# Patient Record
Sex: Female | Born: 1960 | Race: Black or African American | Hispanic: No | Marital: Married | Smoking: Never smoker
Health system: Southern US, Community
[De-identification: ages and names within clinical notes are randomized; demographics above are authoritative.]

## PROBLEM LIST (undated history)

## (undated) DIAGNOSIS — Z789 Other specified health status: Secondary | ICD-10-CM

## (undated) HISTORY — DX: Other specified health status: Z78.9

## (undated) HISTORY — PX: TUBAL LIGATION: SHX77

---

## 2000-02-29 ENCOUNTER — Emergency Department (HOSPITAL_COMMUNITY): Admission: EM | Admit: 2000-02-29 | Discharge: 2000-02-29 | Payer: Self-pay | Admitting: Emergency Medicine

## 2000-07-01 ENCOUNTER — Emergency Department (HOSPITAL_COMMUNITY): Admission: EM | Admit: 2000-07-01 | Discharge: 2000-07-01 | Payer: Self-pay | Admitting: Emergency Medicine

## 2000-07-01 ENCOUNTER — Encounter: Payer: Self-pay | Admitting: Emergency Medicine

## 2005-01-16 ENCOUNTER — Other Ambulatory Visit: Admission: RE | Admit: 2005-01-16 | Discharge: 2005-01-16 | Payer: Self-pay | Admitting: Obstetrics and Gynecology

## 2006-06-17 ENCOUNTER — Encounter: Admission: RE | Admit: 2006-06-17 | Discharge: 2006-06-17 | Payer: Self-pay | Admitting: Obstetrics and Gynecology

## 2006-07-08 ENCOUNTER — Ambulatory Visit (HOSPITAL_COMMUNITY): Admission: RE | Admit: 2006-07-08 | Discharge: 2006-07-08 | Payer: Self-pay | Admitting: Obstetrics and Gynecology

## 2006-08-04 ENCOUNTER — Encounter (INDEPENDENT_AMBULATORY_CARE_PROVIDER_SITE_OTHER): Payer: Self-pay | Admitting: Obstetrics and Gynecology

## 2006-08-04 ENCOUNTER — Ambulatory Visit (HOSPITAL_COMMUNITY): Admission: RE | Admit: 2006-08-04 | Discharge: 2006-08-04 | Payer: Self-pay | Admitting: Obstetrics and Gynecology

## 2009-10-23 ENCOUNTER — Other Ambulatory Visit: Admission: RE | Admit: 2009-10-23 | Discharge: 2009-10-23 | Payer: Self-pay | Admitting: Obstetrics and Gynecology

## 2010-02-24 ENCOUNTER — Encounter: Payer: Self-pay | Admitting: Obstetrics and Gynecology

## 2010-06-18 NOTE — Op Note (Signed)
Ann Aguilar, Ann Aguilar         ACCOUNT NO.:  [id number]   MEDICAL RECORD NO.:  [id number]          PATIENT TYPE:  AMB   LOCATION:  SDC                           FACILITY:  [facility]   PHYSICIAN:  Osborn Coho, M.D.   DATE OF BIRTH:  04/18/1960   DATE OF PROCEDURE:  DATE OF DISCHARGE:                               OPERATIVE REPORT   PREOPERATIVE DIAGNOSIS:  Menorrhagia.   POSTOPERATIVE DIAGNOSIS:  Menorrhagia.   PROCEDURE:  1. Hysteroscopy.  2. Dilation and curettage.  3. Endometrial ablation via NovaSure.   SURGEON:  Osborn Coho, M.D.   ANESTHESIA:  General anesthesia via LMA.   SPECIMENS TO PATHOLOGY:  Endometrial curettings.   FLUIDS:  Was 900 mL.   URINE OUTPUT:  Was quantity sufficient via straight catheterization  prior to procedure.   ESTIMATED BLOOD LOSS:  Minimal.   COMPLICATIONS:  None.   HYSTEROSCOPIC FLUID DEFICIT:  Was lactated Ringer's 95 mL.   FINDINGS:  The uterus sounded to 11.5 cm.  Cervical length measured 5  cm.  The cavity length was 6.5 cm.  The cavity width was 4.8 cm.  Power  was 172 watts.  Time was 1 minute and 5 seconds.   DESCRIPTION OF PROCEDURE:  The patient was taken to the operating room  after the risks, benefits and alternatives were reviewed with the  patient.  The patient verbalized understanding and a consent was signed  and witnessed.  The patient was placed under general per anesthesia and  prepped and draped in the normal sterile fashion in the dorsal lithotomy  position.  A bi-valve speculum was placed in the patient's vagina and  the anterior lip of the cervix grasped with a single-tooth tenaculum.  A  paracervical block administered using a total of 10 mL of 1% lidocaine.  The cervix was dilated for the passage of the hysteroscope and  measurements taken as above.  The hysteroscope was introduced and no  inter-cavitary lesions were noted.  The cervix was dilated for passage  of the NovaSure instrument.  The NovaSure  instrument was then  introduced.  Ablation was performed with findings as noted above.  The  endometrial cavity was adequately ablated and all instruments were  removed.  The hysteroscopy was performed after the ablation, to confirm  ablation.  The sponge count was correct.   The patient tolerated the procedure well and is currently awaiting  extubation or awakening and will be transferred to the recovery room.      Osborn Coho, M.D.  Electronically Signed     AR/MEDQ  D:  08/04/2006  T:  08/04/2006  Job:  [id number]

## 2010-11-19 LAB — HCG, SERUM, QUALITATIVE: Preg, Serum: NEGATIVE

## 2010-11-19 LAB — CBC
HCT: 33.2 — ABNORMAL LOW
Hemoglobin: 10.9 — ABNORMAL LOW
MCHC: 32.8
MCV: 84.1
Platelets: 336
RBC: 3.95
RDW: 16.1 — ABNORMAL HIGH
WBC: 4.7

## 2016-03-26 ENCOUNTER — Other Ambulatory Visit (HOSPITAL_COMMUNITY)
Admission: RE | Admit: 2016-03-26 | Discharge: 2016-03-26 | Disposition: A | Discharge status: Home / Self Care | Payer: Self-pay | Source: Ambulatory Visit | Attending: Family Medicine | Admitting: Family Medicine

## 2016-03-26 ENCOUNTER — Other Ambulatory Visit: Payer: Self-pay | Admitting: Family Medicine

## 2016-03-26 DIAGNOSIS — Z0000 Encounter for general adult medical examination without abnormal findings: Secondary | ICD-10-CM | POA: Insufficient documentation

## 2016-03-26 DIAGNOSIS — Z1151 Encounter for screening for human papillomavirus (HPV): Secondary | ICD-10-CM | POA: Insufficient documentation

## 2016-04-01 LAB — CYTOLOGY - PAP
Diagnosis: UNDETERMINED — AB
HPV: NOT DETECTED

## 2017-04-10 ENCOUNTER — Other Ambulatory Visit: Payer: Self-pay | Admitting: Family Medicine

## 2017-04-10 ENCOUNTER — Other Ambulatory Visit (HOSPITAL_COMMUNITY)
Admission: RE | Admit: 2017-04-10 | Discharge: 2017-04-10 | Disposition: A | Discharge status: Home / Self Care | Payer: BLUE CROSS/BLUE SHIELD | Source: Ambulatory Visit | Attending: Family Medicine | Admitting: Family Medicine

## 2017-04-10 DIAGNOSIS — Z01419 Encounter for gynecological examination (general) (routine) without abnormal findings: Secondary | ICD-10-CM | POA: Diagnosis not present

## 2017-04-15 LAB — CYTOLOGY - PAP
Diagnosis: UNDETERMINED — AB
HPV: NOT DETECTED

## 2018-07-25 ENCOUNTER — Other Ambulatory Visit: Payer: Self-pay | Admitting: *Deleted

## 2018-07-25 DIAGNOSIS — Z20822 Contact with and (suspected) exposure to covid-19: Secondary | ICD-10-CM

## 2018-08-01 LAB — NOVEL CORONAVIRUS, NAA: SARS-CoV-2, NAA: NOT DETECTED

## 2020-11-09 ENCOUNTER — Ambulatory Visit (INDEPENDENT_AMBULATORY_CARE_PROVIDER_SITE_OTHER): Payer: PRIVATE HEALTH INSURANCE | Admitting: Obstetrics

## 2020-11-09 ENCOUNTER — Encounter: Payer: Self-pay | Admitting: Obstetrics

## 2020-11-09 ENCOUNTER — Other Ambulatory Visit: Payer: Self-pay

## 2020-11-09 ENCOUNTER — Other Ambulatory Visit (HOSPITAL_COMMUNITY)
Admission: RE | Admit: 2020-11-09 | Discharge: 2020-11-09 | Disposition: A | Discharge status: Home / Self Care | Payer: No Typology Code available for payment source | Source: Ambulatory Visit | Attending: Obstetrics & Gynecology | Admitting: Obstetrics & Gynecology

## 2020-11-09 VITALS — BP 149/90 | HR 80 | Ht 65.0 in | Wt 249.0 lb

## 2020-11-09 DIAGNOSIS — E66813 Obesity, class 3: Secondary | ICD-10-CM

## 2020-11-09 DIAGNOSIS — Z1239 Encounter for other screening for malignant neoplasm of breast: Secondary | ICD-10-CM

## 2020-11-09 DIAGNOSIS — Z01419 Encounter for gynecological examination (general) (routine) without abnormal findings: Secondary | ICD-10-CM | POA: Insufficient documentation

## 2020-11-09 DIAGNOSIS — Z0000 Encounter for general adult medical examination without abnormal findings: Secondary | ICD-10-CM

## 2020-11-09 DIAGNOSIS — Z6841 Body Mass Index (BMI) 40.0 and over, adult: Secondary | ICD-10-CM

## 2020-11-09 NOTE — Progress Notes (Signed)
NEW GYN  presents for AEX/PAP.  Last Mammogram was maybe 2 years ago. Reports no complaints today.

## 2020-11-09 NOTE — Progress Notes (Signed)
Subjective:        Ann Aguilar is a 60 y.o. female here for a routine exam.  Current complaints: None.    Personal health questionnaire:  Is patient Ashkenazi Jewish, have a family history of breast and/or ovarian cancer: no Is there a family history of uterine cancer diagnosed at age < 34, gastrointestinal cancer, urinary tract cancer, family member who is a Personnel officer syndrome-associated carrier: no Is the patient overweight and hypertensive, family history of diabetes, personal history of gestational diabetes, preeclampsia or PCOS: no Is patient over 19, have PCOS,  family history of premature CHD under age 70, diabetes, smoke, have hypertension or peripheral artery disease:  no At any time, has a partner hit, kicked or otherwise hurt or frightened you?: no Over the past 2 weeks, have you felt down, depressed or hopeless?: no Over the past 2 weeks, have you felt little interest or pleasure in doing things?:no   Gynecologic History No LMP recorded. Patient is postmenopausal. Contraception: post menopausal status Last Pap: 2019. Results were: ASCUS with negative HRHPV Last mammogram: ~ 2 years ago. Results were: normal  Obstetric History OB History  Gravida Para Term Preterm AB Living  2 2 2     2   SAB IAB Ectopic Multiple Live Births               # Outcome Date GA Lbr Len/2nd Weight Sex Delivery Anes PTL Lv  2 Term           1 Term             Past Medical History:  Diagnosis Date   Medical history non-contributory     History reviewed. No pertinent surgical history.  No current outpatient medications on file. No Known Allergies  Social History   Tobacco Use   Smoking status: Never   Smokeless tobacco: Never  Substance Use Topics   Alcohol use: Never    History reviewed. No pertinent family history.    Review of Systems  Constitutional: negative for fatigue and weight loss Respiratory: negative for cough and wheezing Cardiovascular: negative for  chest pain, fatigue and palpitations Gastrointestinal: negative for abdominal pain and change in bowel habits Musculoskeletal:negative for myalgias Neurological: negative for gait problems and tremors Behavioral/Psych: negative for abusive relationship, depression Endocrine: negative for temperature intolerance    Genitourinary:negative for abnormal menstrual periods, genital lesions, hot flashes, sexual problems and vaginal discharge Integument/breast: negative for breast lump, breast tenderness, nipple discharge and skin lesion(s)    Objective:       BP (!) 149/90   Pulse 80   Ht 5\' 5"  (1.651 m)   Wt 249 lb (112.9 kg)   BMI 41.44 kg/m  General:   Alert and no distress  Skin:   no rash or abnormalities  Lungs:   clear to auscultation bilaterally  Heart:   regular rate and rhythm, S1, S2 normal, no murmur, click, rub or gallop  Breasts:   normal without suspicious masses, skin or nipple changes or axillary nodes  Abdomen:  normal findings: no organomegaly, soft, non-tender and no hernia  Pelvis:  External genitalia: normal general appearance Urinary system: urethral meatus normal and bladder without fullness, nontender Vaginal: normal without tenderness, induration or masses Cervix: normal appearance Adnexa: normal bimanual exam Uterus: anteverted and non-tender, normal size   Lab Review Urine pregnancy test Labs reviewed yes Radiologic studies reviewed yes  I have spent a total of 20 minutes of face-to-face time, excluding clinical staff  time, reviewing notes and preparing to see patient, ordering tests and/or medications, and counseling the patient.   Assessment:   1. Encounter for routine gynecological examination with Papanicolaou smear of cervix Rx: - Cytology - PAP( [place])  2. Screening breast examination Rx: - MM Digital Screening; Future  3. Class 3 severe obesity due to excess calories without serious comorbidity with body mass index (BMI) of 40.0 to  44.9 in adult (HCC) - weight reduction with the aid of dietary changes, exercise and behavioral modification recommended  4. Routine health maintenance Rx: - Ambulatory referral to Internal Medicine     Plan:    Education reviewed: calcium supplements, depression evaluation, low fat, low cholesterol diet, safe sex/STD prevention, self breast exams, and weight bearing exercise. Mammogram ordered. Follow up in: 1 year.    Orders Placed This Encounter  Procedures   MM Digital Screening    Standing Status:   Future    Standing Expiration Date:   11/08/2021    Order Specific Question:   Reason for Exam (SYMPTOM  OR DIAGNOSIS REQUIRED)    Answer:   Screening    Order Specific Question:   Is the patient pregnant?    Answer:   No    Order Specific Question:   Preferred imaging location?    Answer:   [facility]   Ambulatory referral to Internal Medicine    Referral Priority:   Routine    Referral Type:   Consultation    Referral Reason:   Specialty Services Required    Requested Specialty:   Internal Medicine    Number of Visits Requested:   1     Brock Bad, MD 11/09/2020 10:04 AM

## 2020-11-13 ENCOUNTER — Ambulatory Visit (HOSPITAL_BASED_OUTPATIENT_CLINIC_OR_DEPARTMENT_OTHER): Admission: RE | Admit: 2020-11-13 | Payer: PRIVATE HEALTH INSURANCE | Source: Ambulatory Visit | Admitting: Radiology

## 2020-11-13 LAB — CYTOLOGY - PAP
Comment: NEGATIVE
Diagnosis: NEGATIVE
Diagnosis: REACTIVE
High risk HPV: NEGATIVE

## 2020-11-14 ENCOUNTER — Other Ambulatory Visit: Payer: Self-pay

## 2020-11-14 ENCOUNTER — Ambulatory Visit (HOSPITAL_BASED_OUTPATIENT_CLINIC_OR_DEPARTMENT_OTHER)
Admission: RE | Admit: 2020-11-14 | Discharge: 2020-11-14 | Disposition: A | Discharge status: Home / Self Care | Payer: PRIVATE HEALTH INSURANCE | Source: Ambulatory Visit | Attending: Obstetrics | Admitting: Obstetrics

## 2020-11-14 DIAGNOSIS — Z1231 Encounter for screening mammogram for malignant neoplasm of breast: Secondary | ICD-10-CM | POA: Diagnosis not present

## 2020-11-14 DIAGNOSIS — Z1239 Encounter for other screening for malignant neoplasm of breast: Secondary | ICD-10-CM

## 2020-11-20 ENCOUNTER — Other Ambulatory Visit: Payer: Self-pay | Admitting: Obstetrics

## 2020-11-20 DIAGNOSIS — R928 Other abnormal and inconclusive findings on diagnostic imaging of breast: Secondary | ICD-10-CM

## 2020-12-12 ENCOUNTER — Other Ambulatory Visit: Payer: Self-pay

## 2020-12-12 DIAGNOSIS — R928 Other abnormal and inconclusive findings on diagnostic imaging of breast: Secondary | ICD-10-CM

## 2021-01-08 ENCOUNTER — Ambulatory Visit: Payer: Self-pay | Admitting: *Deleted

## 2021-01-08 ENCOUNTER — Ambulatory Visit
Admission: RE | Admit: 2021-01-08 | Discharge: 2021-01-08 | Disposition: A | Discharge status: Home / Self Care | Payer: PRIVATE HEALTH INSURANCE | Source: Ambulatory Visit | Attending: Obstetrics and Gynecology | Admitting: Obstetrics and Gynecology

## 2021-01-08 ENCOUNTER — Other Ambulatory Visit: Payer: Self-pay

## 2021-01-08 ENCOUNTER — Ambulatory Visit
Admission: RE | Admit: 2021-01-08 | Discharge: 2021-01-08 | Disposition: A | Discharge status: Home / Self Care | Payer: No Typology Code available for payment source | Source: Ambulatory Visit | Attending: Obstetrics and Gynecology | Admitting: Obstetrics and Gynecology

## 2021-01-08 VITALS — BP 138/102 | Wt 249.6 lb

## 2021-01-08 DIAGNOSIS — Z1239 Encounter for other screening for malignant neoplasm of breast: Secondary | ICD-10-CM

## 2021-01-08 DIAGNOSIS — R928 Other abnormal and inconclusive findings on diagnostic imaging of breast: Secondary | ICD-10-CM

## 2021-01-08 DIAGNOSIS — Z1211 Encounter for screening for malignant neoplasm of colon: Secondary | ICD-10-CM

## 2021-01-08 NOTE — Patient Instructions (Signed)
Explained breast self awareness with Ann Aguilar. Patient did not need a Pap smear today due to last Pap smear and HPV typing was 11/09/2020. Let her know [facility]CP will cover Pap smears and HPV typing every 5 years unless has a history of abnormal Pap smears. Referred patient to the [facility] for a right breast diagnostic mammogram per recommendation. Appointment scheduled Tuesday, January 08, 2021 at 1010. Patient aware of appointment and will be there. Ann Aguilar verbalized understanding.  Ann Aguilar, [name], RN 8:48 AM

## 2021-01-08 NOTE — Progress Notes (Signed)
[name] is a 60 y.o. female who presents to [facility] clinic today with no complaints. Patient referred to [facility] by the [facility] due to having a screening mammogram on 11/14/2020 that additional imaging of the right breast is recommended for follow-up   Pap Smear: Pap smear not completed today. Last Pap smear was 11/09/2020 at [facility] clinic and was normal with negative HPV. Per patient has no history of an abnormal Pap smear. Last Pap smear result is available in Epic.   Physical exam: Breasts Breasts symmetrical. No skin abnormalities bilateral breasts. No nipple retraction bilateral breasts. No nipple discharge bilateral breasts. No lymphadenopathy. No lumps palpated bilateral breasts. No complaints of pain or tenderness on exam.     MM 3D SCREEN BREAST BILATERAL  Result Date: 11/19/2020 CLINICAL DATA:  Screening. EXAM: DIGITAL SCREENING BILATERAL MAMMOGRAM WITH TOMOSYNTHESIS AND CAD TECHNIQUE: Bilateral screening digital craniocaudal and mediolateral oblique mammograms were obtained. Bilateral screening digital breast tomosynthesis was performed. The images were evaluated with computer-aided detection. COMPARISON:  Previous exam(s). ACR Breast Density Category b: There are scattered areas of fibroglandular density. FINDINGS: In the right breast, a possible mass and a possible asymmetry warrants further evaluation. The possible mass is seen within the upper RIGHT breast on MLO slice 27. The possible asymmetry is seen within the outer RIGHT breast on cc slice 32. In the left breast, no findings suspicious for malignancy. IMPRESSION: Further evaluation is suggested for a possible mass and a possible asymmetry in the right breast. RECOMMENDATION: Diagnostic mammogram and possibly ultrasound of the right breast. (Code:[id number]) The patient will be contacted regarding the findings, and additional imaging will be scheduled. BI-RADS  CATEGORY  0: Incomplete. Need additional imaging evaluation and/or prior mammograms for comparison. Electronically Signed   By: Bary Richard M.D.   On: 11/19/2020 11:57     Pelvic/Bimanual Pap is not indicated today per BCCCP guidelines.    Smoking History: Patient has never smoked.   Patient Navigation: Patient education provided. Access to services provided for patient through BCCCP program.   Colorectal Cancer Screening: Per patient had a colonoscopy completed around 8 years ago. FIT Test given to patient to complete. No complaints today.    Breast and Cervical Cancer Risk Assessment: Patient does not have family history of breast cancer, known genetic mutations, or radiation treatment to the chest before age 85. Patient does not have history of cervical dysplasia, immunocompromised, or DES exposure in-utero.  Risk Assessment     Risk Scores       01/08/2021   Last edited by: Meryl Dare, CMA   5-year risk: 1.4 %   Lifetime risk: 7.3 %            A: BCCCP exam without pap smear No complaints.  P: Referred patient to the [facility] for a right breast diagnostic mammogram per recommendation. Appointment scheduled Tuesday, January 08, 2021 at 1010.  Priscille Heidelberg, RN 01/08/2021 8:48 AM

## 2021-09-19 ENCOUNTER — Other Ambulatory Visit: Payer: Self-pay

## 2021-09-19 DIAGNOSIS — Z1231 Encounter for screening mammogram for malignant neoplasm of breast: Secondary | ICD-10-CM

## 2021-11-12 ENCOUNTER — Ambulatory Visit: Payer: No Typology Code available for payment source

## 2021-11-12 ENCOUNTER — Inpatient Hospital Stay: Admission: RE | Admit: 2021-11-12 | Payer: No Typology Code available for payment source | Source: Ambulatory Visit

## 2022-01-09 ENCOUNTER — Ambulatory Visit
Admission: RE | Admit: 2022-01-09 | Discharge: 2022-01-09 | Disposition: A | Discharge status: Home / Self Care | Payer: No Typology Code available for payment source | Source: Ambulatory Visit | Attending: Obstetrics and Gynecology | Admitting: Obstetrics and Gynecology

## 2022-01-09 ENCOUNTER — Ambulatory Visit: Payer: Self-pay | Admitting: Hematology and Oncology

## 2022-01-09 VITALS — BP 130/90 | Wt 255.0 lb

## 2022-01-09 DIAGNOSIS — Z1231 Encounter for screening mammogram for malignant neoplasm of breast: Secondary | ICD-10-CM

## 2022-01-09 DIAGNOSIS — Z1211 Encounter for screening for malignant neoplasm of colon: Secondary | ICD-10-CM

## 2022-01-09 NOTE — Progress Notes (Signed)
Ann Aguilar is a 61 y.o. female who presents to [facility] clinic today with no complaints.    Pap Smear: Pap not smear completed today. Last Pap smear was 2022 and was normal. Per patient has history of an abnormal Pap smear. Last Pap smear result is not available in Epic. 2018 ASCUS/ HPV-; 2019 ASCUS/ HPV-; 2022 Negative/ PV-   Physical exam: Breasts Breasts symmetrical. No skin abnormalities bilateral breasts. No nipple retraction bilateral breasts. No nipple discharge bilateral breasts. No lymphadenopathy. No lumps palpated bilateral breasts.     MS DIGITAL DIAG TOMO UNI RIGHT  Result Date: 01/08/2021 CLINICAL DATA:  Screening recall for a possible mass and a possible asymmetry in the right breast. EXAM: DIGITAL DIAGNOSTIC UNILATERAL RIGHT MAMMOGRAM WITH TOMOSYNTHESIS AND CAD; ULTRASOUND RIGHT BREAST LIMITED TECHNIQUE: Right digital diagnostic mammography and breast tomosynthesis was performed. The images were evaluated with computer-aided detection.; Targeted ultrasound examination of the right breast was performed COMPARISON:  11/14/2020 and 06/17/2006. ACR Breast Density Category b: There are scattered areas of fibroglandular density. FINDINGS: The possible mass and possible asymmetry both disperse with spot compression imaging consistent with superimposed fibroglandular tissue. There is no defined mass, significant residual asymmetry, area of architectural distortion and there are no suspicious calcifications. On physical exam, no mass is palpated in the upper outer right breast. Targeted ultrasound is performed, showing normal fibroglandular tissue throughout the upper inner and upper outer right breast. No mass or suspicious lesion. IMPRESSION: 1. No evidence of breast malignancy. RECOMMENDATION: Screening mammogram in one year.(Code:[id number]) I have discussed the findings and recommendations with the patient. If applicable, a reminder letter will be sent to the patient regarding the next  appointment. BI-RADS CATEGORY  1: Negative. Electronically Signed   By: Amie Portland M.D.   On: 01/08/2021 11:16   MM 3D SCREEN BREAST BILATERAL  Result Date: 11/19/2020 CLINICAL DATA:  Screening. EXAM: DIGITAL SCREENING BILATERAL MAMMOGRAM WITH TOMOSYNTHESIS AND CAD TECHNIQUE: Bilateral screening digital craniocaudal and mediolateral oblique mammograms were obtained. Bilateral screening digital breast tomosynthesis was performed. The images were evaluated with computer-aided detection. COMPARISON:  Previous exam(s). ACR Breast Density Category b: There are scattered areas of fibroglandular density. FINDINGS: In the right breast, a possible mass and a possible asymmetry warrants further evaluation. The possible mass is seen within the upper RIGHT breast on MLO slice 27. The possible asymmetry is seen within the outer RIGHT breast on cc slice 32. In the left breast, no findings suspicious for malignancy. IMPRESSION: Further evaluation is suggested for a possible mass and a possible asymmetry in the right breast. RECOMMENDATION: Diagnostic mammogram and possibly ultrasound of the right breast. (Code:[id number]) The patient will be contacted regarding the findings, and additional imaging will be scheduled. BI-RADS CATEGORY  0: Incomplete. Need additional imaging evaluation and/or prior mammograms for comparison. Electronically Signed   By: Bary Richard M.D.   On: 11/19/2020 11:57      Pelvic/Bimanual Pap is not indicated today    Smoking History: Patient has never smoked and was not referred to quit line.    Patient Navigation: Patient education provided. Access to services provided for patient through [facility] program. No interpreter provided. No transportation provided   Colorectal Cancer Screening: Per patient has never had colonoscopy completed No complaints today. FIT given.   Breast and Cervical Cancer Risk Assessment: Patient does not have family history of breast cancer, known genetic  mutations, or radiation treatment to the chest before age 40. Patient does not have history of cervical  dysplasia, immunocompromised, or DES exposure in-utero.  Risk Assessment   No risk assessment data for the current encounter  Risk Scores       01/08/2021   Last edited by: Royston Bake, CMA   5-year risk: 1.4 %   Lifetime risk: 7.3 %            A: BCCCP exam without pap smear No complaints with benign exam.   P: Referred patient to the [facility] for a screening mammogram. Appointment scheduled 01/09/22.  Melodye Ped, NP 01/09/2022 2:34 PM

## 2022-01-09 NOTE — Patient Instructions (Signed)
Taught Ann Aguilar about self breast awareness and gave educational materials to take home. Patient did not need a Pap smear today due to last Pap smear was in 2022 per patient and was normal. She requests pap next year.  Let her know B[facility]CP will cover Pap smears every 3 years unless has a history of abnormal Pap smears. Referred patient to the [facility] for screening mammogram. Appointment scheduled for 01/09/22. Patient aware of appointment and will be there. Let patient know will follow up with her within the next couple weeks with results. Ann Aguilar verbalized understanding.  Pascal Lux, NP 3:00 PM

## 2022-01-21 NOTE — Progress Notes (Signed)
FIT test has been mailed to the pt 01/21/2022. 

## 2023-01-18 IMAGING — MG MM DIGITAL DIAGNOSTIC UNILAT*R* W/ TOMO W/ CAD
6 series · 6 of 18 positions shown · non-contrast
Comparison: 11/14/2020 and 06/17/2006.

CLINICAL DATA: Screening recall for a possible mass and a possible
asymmetry in the right breast.

EXAM:
DIGITAL DIAGNOSTIC UNILATERAL RIGHT MAMMOGRAM WITH TOMOSYNTHESIS AND
CAD; ULTRASOUND RIGHT BREAST LIMITED
TECHNIQUE: Right digital diagnostic mammography and breast tomosynthesis was
performed. The images were evaluated with computer-aided detection.;
Targeted ultrasound examination of the right breast was performed

[R ML synth-2D]
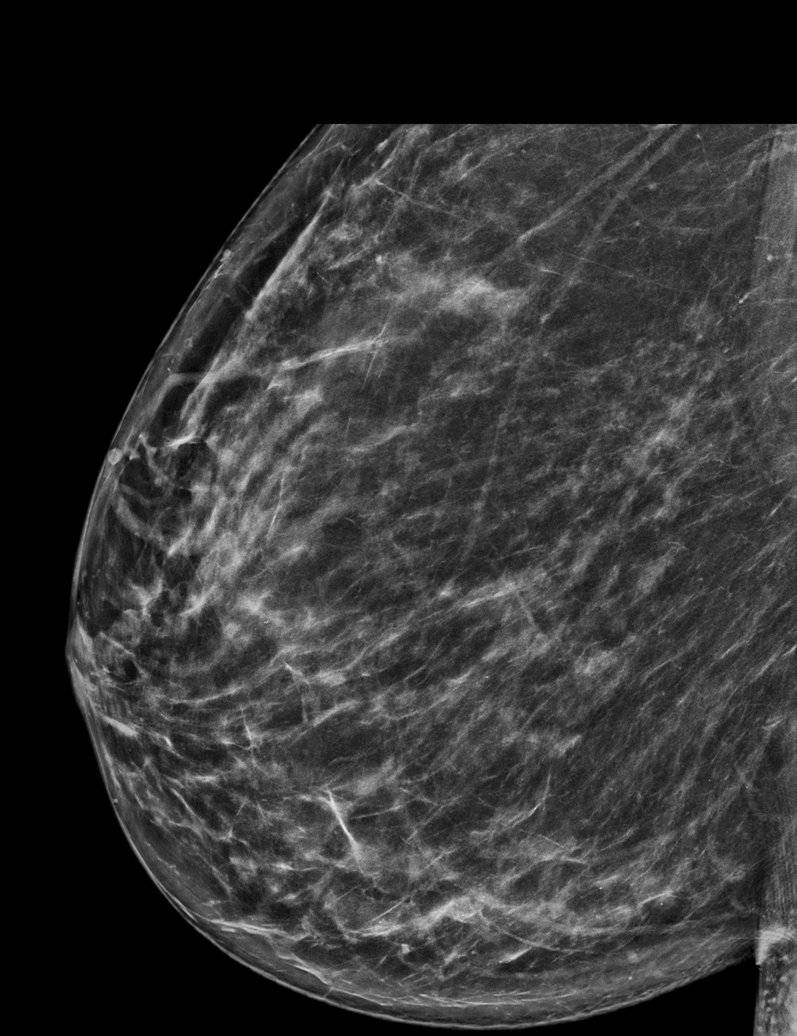

[R CC synth-2D]
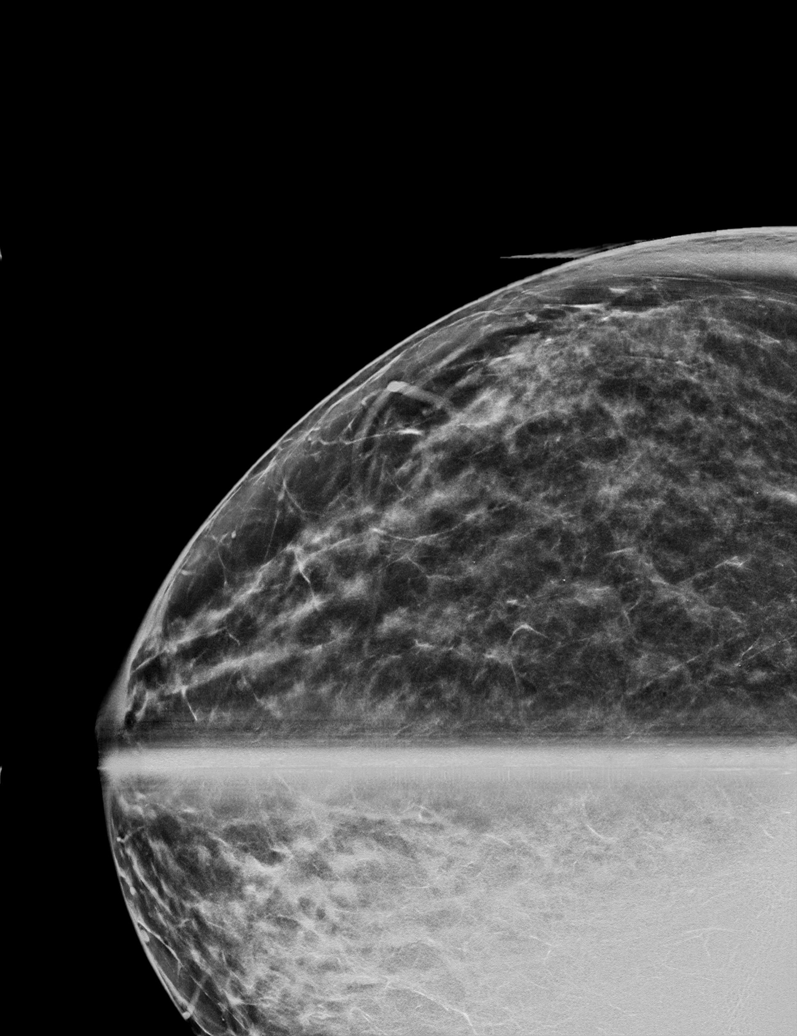

[R MLO synth-2D]
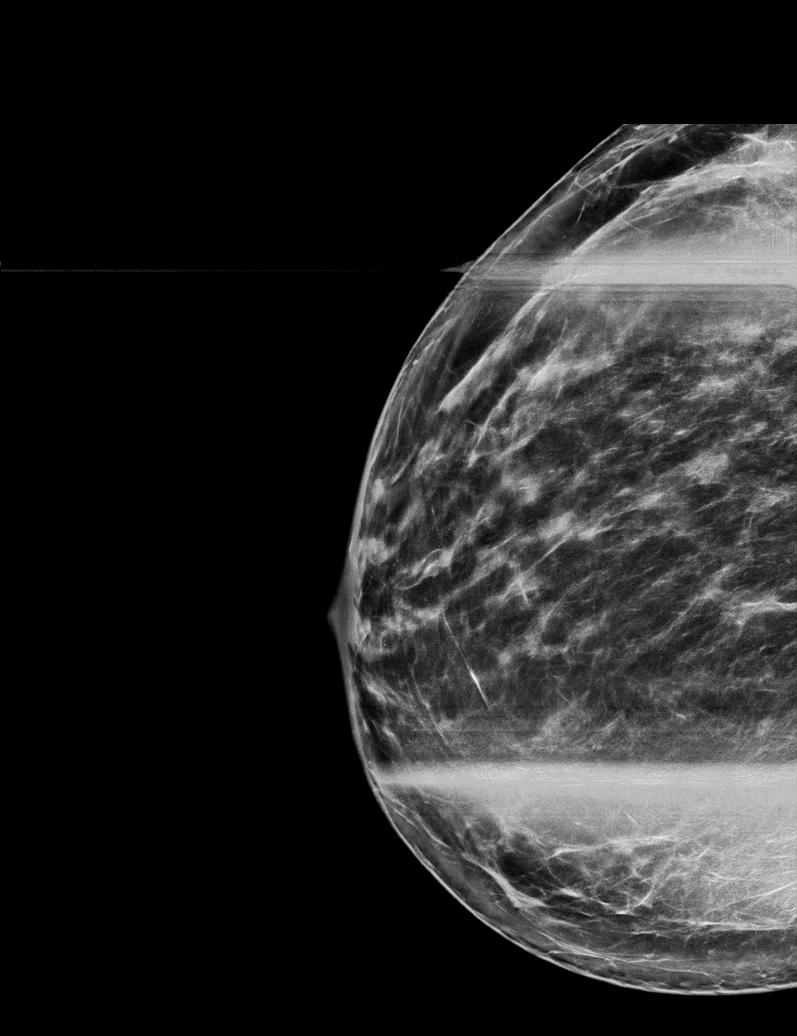

[R ML tomo · tomo slice 39/77.0]
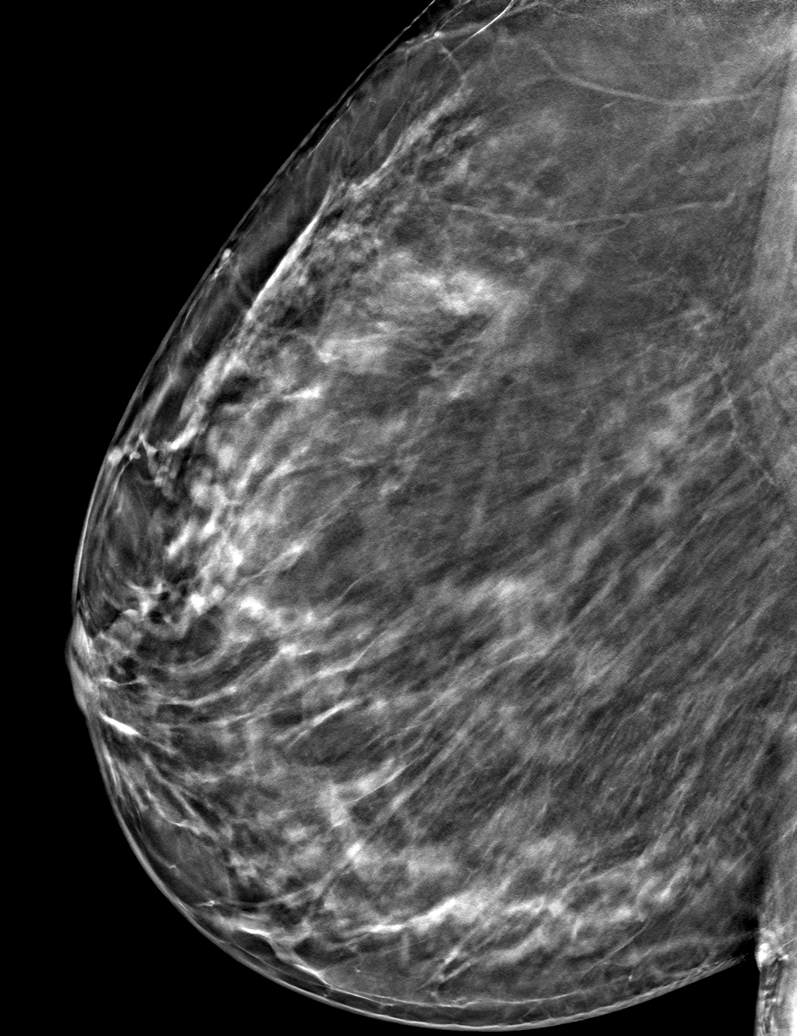

[R MLO tomo · tomo slice 37/74.0]
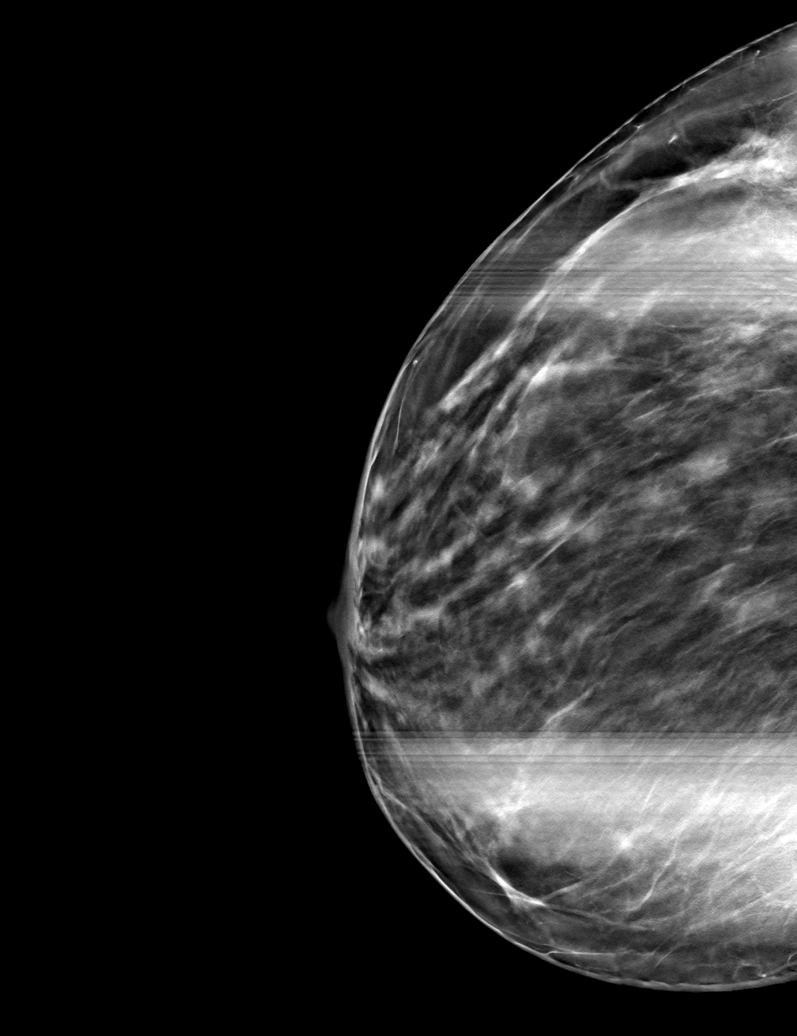

[R CC tomo · tomo slice 34/67.0]
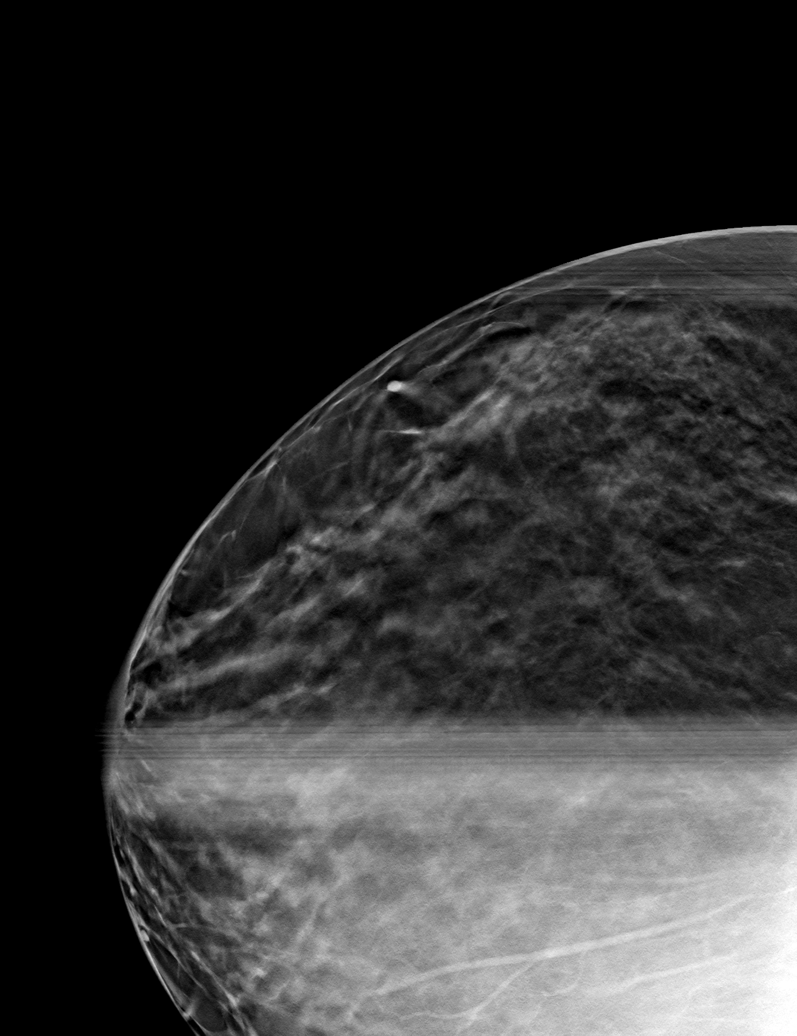

[6 of 18 positions shown; findings below may reference images not displayed]

ACR Breast Density Category b: There are scattered areas of
fibroglandular density.
FINDINGS: The possible mass and possible asymmetry both disperse with spot
compression imaging consistent with superimposed fibroglandular
tissue. There is no defined mass, significant residual asymmetry,
area of architectural distortion and there are no suspicious
calcifications.

On physical exam, no mass is palpated in the upper outer right
breast.

Targeted ultrasound is performed, showing normal fibroglandular
tissue throughout the upper inner and upper outer right breast. No
mass or suspicious lesion.
IMPRESSION: 1. No evidence of breast malignancy.

RECOMMENDATION:
Screening mammogram in one year.(Code:[id number])

I have discussed the findings and recommendations with the patient.
If applicable, a reminder letter will be sent to the patient
regarding the next appointment.

BI-RADS CATEGORY  1: Negative.

## 2023-01-22 ENCOUNTER — Other Ambulatory Visit: Payer: Self-pay | Admitting: Obstetrics and Gynecology

## 2023-01-22 ENCOUNTER — Telehealth: Payer: Self-pay

## 2023-01-22 DIAGNOSIS — Z1231 Encounter for screening mammogram for malignant neoplasm of breast: Secondary | ICD-10-CM

## 2023-01-22 NOTE — Telephone Encounter (Signed)
Patient telephoned BCCCP and will call back and complete screening process.

## 2023-03-05 ENCOUNTER — Ambulatory Visit (INDEPENDENT_AMBULATORY_CARE_PROVIDER_SITE_OTHER): Payer: Self-pay | Admitting: Family

## 2023-03-05 ENCOUNTER — Encounter: Payer: Self-pay | Admitting: Family

## 2023-03-05 ENCOUNTER — Ambulatory Visit: Payer: Self-pay | Admitting: *Deleted

## 2023-03-05 ENCOUNTER — Ambulatory Visit
Admission: RE | Admit: 2023-03-05 | Discharge: 2023-03-05 | Disposition: A | Discharge status: Home / Self Care | Payer: No Typology Code available for payment source | Source: Ambulatory Visit | Attending: Obstetrics and Gynecology | Admitting: Obstetrics and Gynecology

## 2023-03-05 VITALS — BP 149/77 | Wt 260.0 lb

## 2023-03-05 VITALS — BP 151/76 | HR 55 | Temp 97.3°F | Ht 65.0 in | Wt 263.0 lb

## 2023-03-05 DIAGNOSIS — I10 Essential (primary) hypertension: Secondary | ICD-10-CM

## 2023-03-05 DIAGNOSIS — Z1239 Encounter for other screening for malignant neoplasm of breast: Secondary | ICD-10-CM

## 2023-03-05 DIAGNOSIS — Z0000 Encounter for general adult medical examination without abnormal findings: Secondary | ICD-10-CM

## 2023-03-05 DIAGNOSIS — Z1231 Encounter for screening mammogram for malignant neoplasm of breast: Secondary | ICD-10-CM

## 2023-03-05 LAB — CBC WITH DIFFERENTIAL/PLATELET
Basophils Absolute: 0 10*3/uL (ref 0.0–0.1)
Basophils Relative: 0.6 % (ref 0.0–3.0)
Eosinophils Absolute: 0.1 10*3/uL (ref 0.0–0.7)
Eosinophils Relative: 3 % (ref 0.0–5.0)
HCT: 40.1 % (ref 36.0–46.0)
Hemoglobin: 13.1 g/dL (ref 12.0–15.0)
Lymphocytes Relative: 43 % (ref 12.0–46.0)
Lymphs Abs: 1.9 10*3/uL (ref 0.7–4.0)
MCHC: 32.6 g/dL (ref 30.0–36.0)
MCV: 91.7 fL (ref 78.0–100.0)
Monocytes Absolute: 0.4 10*3/uL (ref 0.1–1.0)
Monocytes Relative: 9.8 % (ref 3.0–12.0)
Neutro Abs: 1.9 10*3/uL (ref 1.4–7.7)
Neutrophils Relative %: 43.6 % (ref 43.0–77.0)
Platelets: 289 10*3/uL (ref 150.0–400.0)
RBC: 4.37 Mil/uL (ref 3.87–5.11)
RDW: 13.9 % (ref 11.5–15.5)
WBC: 4.4 10*3/uL (ref 4.0–10.5)

## 2023-03-05 LAB — LIPID PANEL
Cholesterol: 202 mg/dL — ABNORMAL HIGH (ref 0–200)
HDL: 82.2 mg/dL (ref >39.00)
LDL Cholesterol: 110 mg/dL — ABNORMAL HIGH (ref 0–99)
NonHDL: 120.1
Total CHOL/HDL Ratio: 2
Triglycerides: 50 mg/dL (ref 0.0–149.0)
VLDL: 10 mg/dL (ref 0.0–40.0)

## 2023-03-05 LAB — COMPREHENSIVE METABOLIC PANEL
ALT: 17 U/L (ref 0–35)
AST: 24 U/L (ref 0–37)
Albumin: 3.8 g/dL (ref 3.5–5.2)
Alkaline Phosphatase: 70 U/L (ref 39–117)
BUN: 11 mg/dL (ref 6–23)
CO2: 31 meq/L (ref 19–32)
Calcium: 9.2 mg/dL (ref 8.4–10.5)
Chloride: 103 meq/L (ref 96–112)
Creatinine, Ser: 0.82 mg/dL (ref 0.40–1.20)
GFR: 77.77 mL/min (ref >60.00)
Glucose, Bld: 83 mg/dL (ref 70–99)
Potassium: 4.1 meq/L (ref 3.5–5.1)
Sodium: 140 meq/L (ref 135–145)
Total Bilirubin: 0.6 mg/dL (ref 0.2–1.2)
Total Protein: 7 g/dL (ref 6.0–8.3)

## 2023-03-05 LAB — TSH: TSH: 5.72 u[IU]/mL — ABNORMAL HIGH (ref 0.35–5.50)

## 2023-03-05 MED ORDER — AMLODIPINE BESYLATE 5 MG PO TABS
5.0000 mg | ORAL_TABLET | Freq: Every evening | ORAL | 1 refills | Status: DC
Start: 2023-03-05 — End: 2023-04-06

## 2023-03-05 NOTE — Assessment & Plan Note (Signed)
Patient has experienced weight fluctuations. Current weight is slightly higher than previous year. Discussed the impact of weight on blood pressure and overall health. -Encourage regular exercise, healthy diet, and increased water intake. -Can discuss further at next visit.

## 2023-03-05 NOTE — Progress Notes (Signed)
Phone [phone]  Subjective:   Patient is a 63 y.o. female presenting for annual physical.    Chief Complaint  Patient presents with   New Patient (Initial Visit)   Annual Exam    Fasting w/ labs    Discussed the use of AI scribe software for clinical note transcription with the patient, who gave verbal consent to proceed.  History of Present Illness   The patient presents for an annual physical exam and to establish care. She is not currently on any medications and has not been previously diagnosed with conditions such as hypertension or diabetes. During this visit, her blood pressure is elevated, particularly the systolic number. Her weight has fluctuated over the past year, with a recent weight of 255 pounds, up from 247 pounds a year ago. She acknowledges that her weight has been variable. She drinks four bottles of water a day, each 16.9 ounces, and is attempting to increase this to five bottles. She exercises about three times a week at [employer] and is considering switching to the [facility] for more class options. She is postmenopausal, having gone through menopause naturally without surgery, and retains her uterus and ovaries. She had a Pap smear two years ago, which was normal, and is scheduled for a mammogram today. She had a colonoscopy in 2017 or 2018 with no polyps found and is due for a follow-up in ten years. She has not had a DEXA scan for osteoporosis. She has been married for thirty years, has two grown children, and two grandchildren. She drinks alcohol rarely, about once a year for special occasions, and has never smoked. Her heart rate is on the lower end, and she has not been informed of this before. Pt denies any fatigue, dizziness, palpitations, or constipation.     See problem oriented charting- ROS- full  review of systems was completed and negative except for: what is noted in HPI above.  The following were reviewed and entered/updated in epic: Past Medical  History:  Diagnosis Date   Medical history non-contributory    Patient Active Problem List   Diagnosis Date Noted   Morbid obesity (HCC) 03/05/2023   Essential (primary) hypertension 03/05/2023   Past Surgical History:  Procedure Laterality Date   TUBAL LIGATION  07/1994    Family History  Problem Relation Age of Onset   Hypertension Mother    Diabetes Sister    Breast cancer Neg Hx     Medications- reviewed and updated Current Outpatient Medications  Medication Sig Dispense Refill   amLODipine (NORVASC) 5 MG tablet Take 1 tablet (5 mg total) by mouth every evening. 30 tablet 1   Multiple Vitamin (MULTIVITAMIN) tablet Take 1 tablet by mouth daily.     No current facility-administered medications for this visit.    Allergies-reviewed and updated No Known Allergies  Social History   Social History Narrative   Not on file    Objective:  BP (!) 151/76 (BP Location: Left Arm, Patient Position: Sitting, Cuff Size: Large)   Pulse (!) 55   Temp (!) 97.3 F (36.3 C) (Temporal)   Ht 5\' 5"  (1.651 m)   Wt 263 lb (119.3 kg)   SpO2 98%   BMI 43.77 kg/m  Physical Exam Vitals and nursing note reviewed.  Constitutional:      Appearance: Normal appearance.  HENT:     Head: Normocephalic.     Right Ear: Tympanic membrane normal.     Left Ear: Tympanic membrane normal.  Nose: Nose normal.     Mouth/Throat:     Mouth: Mucous membranes are moist.  Eyes:     Pupils: Pupils are equal, round, and reactive to light.  Cardiovascular:     Rate and Rhythm: Normal rate and regular rhythm.  Pulmonary:     Effort: Pulmonary effort is normal.     Breath sounds: Normal breath sounds.  Musculoskeletal:        General: Normal range of motion.     Cervical back: Normal range of motion.  Lymphadenopathy:     Cervical: No cervical adenopathy.  Skin:    General: Skin is warm and dry.  Neurological:     Mental Status: She is alert.  Psychiatric:        Mood and Affect: Mood  normal.        Behavior: Behavior normal.     Assessment and Plan   Health Maintenance counseling: 1. Anticipatory guidance: Patient counseled regarding regular dental exams q6 months, eye exams,  avoiding smoking and second hand smoke, limiting alcohol to 1 beverage per day, no illicit drugs.   2. Risk factor reduction:  Advised patient of need for regular exercise and diet rich with fruits and vegetables to reduce risk of heart attack and stroke. Exercise- has gym membership, wants to get more active.  Wt Readings from Last 3 Encounters:  03/05/23 263 lb (119.3 kg)  01/09/22 255 lb (115.7 kg)  01/08/21 249 lb 9.6 oz (113.2 kg)   3. Immunizations/screenings/ancillary studies  There is no immunization history on file for this patient. Health Maintenance Due  Topic Date Due   HIV Screening  Never done   MAMMOGRAM  01/10/2023    4. Cervical cancer screening- last PAP 2 years ago 5. Breast cancer screening-  mammogram  scheduled today 6. Colon cancer screening - done 8 years ago 7. Skin cancer screening- advised regular sunscreen use. Denies worrisome, changing, or new skin lesions.  8. Birth control/STD check- postmenopausal/N/A 9. Osteoporosis screening- never done  10. Alcohol screening: rare 11. Smoking associated screening (lung cancer screening, AAA screen 65-75, UA)- never smoked  Assessment and Plan    Hypertension - Elevated systolic blood pressure noted during visit. No previous history of hypertension. Discussed the role of weight, sodium intake, and exercise in blood pressure management. No current medications. -Recheck of BP at end of visit is higher, staring Amlodipine 5mg  in the evenings. Advised on SE. -Encourage weight management, low sodium diet, and increased exercise, handout provided reinforcing teaching. -F/U in 1 month to check response to medication.  Weight Management - Patient has experienced weight fluctuations. Current weight is slightly higher than  previous year. Discussed the impact of weight on blood pressure and overall health. -Encourage regular exercise, healthy diet, and increased water intake. -Can discuss further at next visit.  General Health Maintenance - -Ordering fasting metabolic panel, TSH, CBC, Lipids, HIV and Hepatitis C screenings. -Encourage regular dental check-ups and oral hygiene. -Consider DEXA scan for osteoporosis screening, although patient is low risk, no family hx or hx of fx. -Continue regular mammograms (scheduled today) and colonoscopies (due in 69yr)  as per guidelines. -Follow up on lab results within a week via MyChart.      Recommended follow up:  Return in about 4 weeks (around 04/02/2023) for HTN. Future Appointments  Date Time Provider Department Center  03/05/2023  1:00 PM [facility]IC [facility] None  03/05/2023  2:00 PM [facility] 1 [facility]   Lab/Order  associations: fasting  Dulce Sellar, NP

## 2023-03-05 NOTE — Progress Notes (Signed)
[name] is a 63 y.o. female who presents to [facility] clinic today with no complaints.    Pap Smear: Pap smear not completed today. Last Pap smear was 11/09/2020 at [facility] clinic and was normal with negative HPV. Per patient has history of two abnormal Pap smears 04/10/2017 and 03/26/2016 that were both ASCUS with negative HPV.  Per patient no follow up was completed other than routine Pap smears. Last Pap smear result is available in Epic.    Physical exam: Breasts Breasts symmetrical. No skin abnormalities bilateral breasts. No nipple retraction bilateral breasts. No nipple discharge bilateral breasts. No lymphadenopathy. No lumps palpated bilateral breasts. No complaints of pain or tenderness on exam.  MS DIGITAL SCREENING TOMO BILATERAL Result Date: 01/10/2022 CLINICAL DATA:  Screening. EXAM: DIGITAL SCREENING BILATERAL MAMMOGRAM WITH TOMOSYNTHESIS AND CAD TECHNIQUE: Bilateral screening digital craniocaudal and mediolateral oblique mammograms were obtained. Bilateral screening digital breast tomosynthesis was performed. The images were evaluated with computer-aided detection. COMPARISON:  Previous exam(s). ACR Breast Density Category b: There are scattered areas of fibroglandular density. FINDINGS: There are no findings suspicious for malignancy. IMPRESSION: No mammographic evidence of malignancy. A result letter of this screening mammogram will be mailed directly to the patient. RECOMMENDATION: Screening mammogram in one year. (Code:SM-B-01Y) BI-RADS CATEGORY  1: Negative. Electronically Signed   By: Sherron Ales M.D.   On: 01/10/2022 17:31   MS DIGITAL DIAG TOMO UNI RIGHT Result Date: 01/08/2021 CLINICAL DATA:  Screening recall for a possible mass and a possible asymmetry in the right breast. EXAM: DIGITAL DIAGNOSTIC UNILATERAL RIGHT MAMMOGRAM WITH TOMOSYNTHESIS AND CAD; ULTRASOUND RIGHT BREAST LIMITED TECHNIQUE: Right digital diagnostic  mammography and breast tomosynthesis was performed. The images were evaluated with computer-aided detection.; Targeted ultrasound examination of the right breast was performed COMPARISON:  11/14/2020 and 06/17/2006. ACR Breast Density Category b: There are scattered areas of fibroglandular density. FINDINGS: The possible mass and possible asymmetry both disperse with spot compression imaging consistent with superimposed fibroglandular tissue. There is no defined mass, significant residual asymmetry, area of architectural distortion and there are no suspicious calcifications. On physical exam, no mass is palpated in the upper outer right breast. Targeted ultrasound is performed, showing normal fibroglandular tissue throughout the upper inner and upper outer right breast. No mass or suspicious lesion. IMPRESSION: 1. No evidence of breast malignancy. RECOMMENDATION: Screening mammogram in one year.(Code:SM-B-01Y) I have discussed the findings and recommendations with the patient. If applicable, a reminder letter will be sent to the patient regarding the next appointment. BI-RADS CATEGORY  1: Negative. Electronically Signed   By: Amie Portland M.D.   On: 01/08/2021 11:16   MM 3D SCREEN BREAST BILATERAL Result Date: 11/19/2020 CLINICAL DATA:  Screening. EXAM: DIGITAL SCREENING BILATERAL MAMMOGRAM WITH TOMOSYNTHESIS AND CAD TECHNIQUE: Bilateral screening digital craniocaudal and mediolateral oblique mammograms were obtained. Bilateral screening digital breast tomosynthesis was performed. The images were evaluated with computer-aided detection. COMPARISON:  Previous exam(s). ACR Breast Density Category b: There are scattered areas of fibroglandular density. FINDINGS: In the right breast, a possible mass and a possible asymmetry warrants further evaluation. The possible mass is seen within the upper RIGHT breast on MLO slice 27. The possible asymmetry is seen within the outer RIGHT breast on cc slice 32. In the left  breast, no findings suspicious for malignancy. IMPRESSION: Further evaluation is suggested for a possible mass and a possible asymmetry in the right breast. RECOMMENDATION: Diagnostic mammogram and possibly ultrasound of the  right breast. (Code:FI-[id number]) The patient will be contacted regarding the findings, and additional imaging will be scheduled. BI-RADS CATEGORY  0: Incomplete. Need additional imaging evaluation and/or prior mammograms for comparison. Electronically Signed   By: Bary Richard M.D.   On: 11/19/2020 11:57    Pelvic/Bimanual Pap is not indicated today per BCCCP guidelines.   Smoking History: Patient has never smoked.   Patient Navigation: Patient education provided. Access to services provided for patient through [place] program. Spanish interpreter Ann Aguilar from [facility] provided.   Colorectal Cancer Screening: Per patient had a colonoscopy completed in 2017 or 2018 that a 10 year follow up colonoscopy was recommended.   Breast and Cervical Cancer Risk Assessment: Patient does not have family history of breast cancer, known genetic mutations, or radiation treatment to the chest before age 58. Patient does not have history of cervical dysplasia, immunocompromised, or DES exposure in-utero.  Risk Scores as of Encounter on 03/05/2023     [facility]           5-year 1.44%   Lifetime 6.89%            Last calculated by Ann Aguilar, CMA on 03/05/2023 at  1:12 PM        A: B[facility]CP exam without pap smear No complaints.  P: Referred patient to the [facility] for a screening mammogram on mobile unit. Appointment scheduled Thursday, March 05, 2023 at 1400.  Priscille Heidelberg, RN 03/05/2023 1:19 PM

## 2023-03-05 NOTE — Assessment & Plan Note (Signed)
Elevated systolic blood pressure noted during visit. No previous history of hypertension. Discussed the role of weight, sodium intake, and exercise in blood pressure management. No current medications. -Recheck of BP at end of visit is higher, staring Amlodipine 5mg  in the evenings. Advised on SE. -Encourage weight management, low sodium diet, and increased exercise. -F/U in 1 month to check response to medication.

## 2023-03-05 NOTE — Patient Instructions (Addendum)
Welcome to [facility], It was a pleasure meeting you today!   I will review your lab results via MyChart in a few days. I have sent over the Amlodipine to start for your blood pressure. Also see the handout attached. Please schedule a 1 month follow up visit today.    PLEASE NOTE: If you had any LAB tests please let us know if you have not heard back within a few days. You may see your results on MyChart before we have a chance to review them but we will give you a call once they are reviewed by [place]. If we ordered any REFERRALS today, please let us know if you have not heard from their office within the next week.  Let us know through MyChart if you are needing REFILLS, or have your pharmacy send [place] the request. You can also use MyChart to communicate with me or any office staff.  Please try these tips to maintain a healthy lifestyle: It is important that you exercise regularly at least 30 minutes 5 times a week. Think about what you will eat, plan ahead. Choose whole foods, & think  "clean, green, fresh or frozen" over canned, processed or packaged foods which are more sugary, salty, and fatty. 70 to 75% of food eaten should be fresh vegetables and protein. 2-3  meals daily with healthy snacks between meals, but must be whole fruit, protein or vegetables. Aim to eat over a 10 hour period when you are active, for example, 7am to 5pm, and then STOP after your last meal of the day, drinking only water.  Shorter eating windows, 6-8 hours, are showing benefits in heart disease and blood sugar regulation. Drink water every day! Shoot for 64 ounces daily = 8 cups, no other drink is as healthy! Fruit juice is best enjoyed in a healthy way, by EATING the fruit.

## 2023-03-05 NOTE — Patient Instructions (Signed)
Explained breast self awareness with Ann Aguilar. Patient did not need a Pap smear today due to last Pap smear and HPV typing was 11/09/2020. Let her know that based on her history that her history of abnormal Pap smears that her next Pap smear will be due in October 2025. Referred patient to the [facility] for a screening mammogram on mobile unit. Appointment scheduled Thursday, March 05, 2023 at 1400. Patient aware of appointment and will be there. Let patient know the Breast Center will follow up with her within the next couple weeks with results of her mammogram by letter or phone. Ann Aguilar verbalized understanding.  Jaedon Siler, [name], RN 1:19 PM

## 2023-03-06 ENCOUNTER — Encounter (INDEPENDENT_AMBULATORY_CARE_PROVIDER_SITE_OTHER): Payer: Self-pay

## 2023-03-06 LAB — HEPATITIS C ANTIBODY: Hepatitis C Ab: NONREACTIVE

## 2023-03-06 LAB — HIV ANTIBODY (ROUTINE TESTING W REFLEX): HIV 1&2 Ab, 4th Generation: NONREACTIVE

## 2023-03-07 ENCOUNTER — Encounter: Payer: Self-pay | Admitting: Family

## 2023-03-09 ENCOUNTER — Encounter: Payer: Self-pay | Admitting: Family

## 2023-04-06 ENCOUNTER — Encounter: Payer: Self-pay | Admitting: Family

## 2023-04-06 ENCOUNTER — Ambulatory Visit (INDEPENDENT_AMBULATORY_CARE_PROVIDER_SITE_OTHER): Payer: Self-pay | Admitting: Family

## 2023-04-06 DIAGNOSIS — Z6841 Body Mass Index (BMI) 40.0 and over, adult: Secondary | ICD-10-CM

## 2023-04-06 DIAGNOSIS — I10 Essential (primary) hypertension: Secondary | ICD-10-CM

## 2023-04-06 MED ORDER — AMLODIPINE BESYLATE 5 MG PO TABS: 5.0000 mg | ORAL_TABLET | Freq: Every evening | ORAL | 1 refills | Status: DC

## 2023-04-06 NOTE — Assessment & Plan Note (Signed)
 Taking Amlodipine 5mg  qpm. No adverse effects reported. Pt has also lost 15lbs since last visit, a few lbs r/t viral illness last week. BP in good range today.  -Continue current medication regimen. -Sending refill of Amlodipine 5mg  for 90d. -F/U in 3 months.

## 2023-04-06 NOTE — Progress Notes (Signed)
 .  Patient ID: Ann Aguilar, female    DOB: 1960/10/17, 63 y.o.   MRN: [id number]  Chief Complaint  Patient presents with   Hypertension   Discussed the use of AI scribe software for clinical note transcription with the patient, who gave verbal consent to proceed.  History of Present Illness   Ann Aguilar is a 63 year old female who presents for a follow-up visit for her hypertension.  Approximately one week ago, she experienced an illness initially suspected to be the flu, leading to a significant decrease in appetite and minimal food intake, though she maintained adequate hydration. A COVID test was negative. She stayed home from work to recover during this period. She is managing hypertension with Amlodipine, started last visit, but sometimes forgets to take it. She is attempting to establish a routine by keeping the medication at her bedside, occasionally missing a dose but taking it the following day.  She has lost approximately fifteen pounds since her last visit, attributing this to both her recent illness and dietary changes. She has been actively reducing sweets and incorporating healthy carbohydrates into her diet. She is also increasing her water intake to aid in weight management and overall health. She plans to increase her physical activity by walking more, especially as the weather improves. She used to walk regularly with a partner but has since fallen out of the habit and is motivated to resume walking as a form of exercise.    Assessment & Plan:   Hypertension - Taking Amlodipine 5mg  qpm. No adverse effects reported. Pt has also lost 15lbs since last visit, a few lbs r/t viral illness last week. BP in good range today.  -Continue current medication regimen. -Sending refill of Amlodipine 5mg  for 90d. -F/U in 3 months.  Obesity - Significant weight loss achieved through dietary changes especially reducing sweets & carbs and increased water  intake. -Encourage continuation of current dietary habits and increased water intake. -Encouraged adding regular exercise to routine.  -F/U prn     Subjective:    Outpatient Medications Prior to Visit  Medication Sig Dispense Refill   amLODipine (NORVASC) 5 MG tablet Take 1 tablet (5 mg total) by mouth every evening. 30 tablet 1   Multiple Vitamin (MULTIVITAMIN) tablet Take 1 tablet by mouth daily.     No facility-administered medications prior to visit.   Past Medical History:  Diagnosis Date   Medical history non-contributory    Past Surgical History:  Procedure Laterality Date   TUBAL LIGATION  07/1994   No Known Allergies    Objective:    Physical Exam Vitals and nursing note reviewed.  Constitutional:      Appearance: Normal appearance. She is obese.  Cardiovascular:     Rate and Rhythm: Normal rate and regular rhythm.  Pulmonary:     Effort: Pulmonary effort is normal.     Breath sounds: Normal breath sounds.  Musculoskeletal:        General: Normal range of motion.  Skin:    General: Skin is warm and dry.  Neurological:     Mental Status: She is alert.  Psychiatric:        Mood and Affect: Mood normal.        Behavior: Behavior normal.   BP 126/78   Pulse 72   Temp (!) 97.3 F (36.3 C) (Temporal)   Ht 5\' 5"  (1.651 m)   Wt 245 lb 12.8 oz (111.5 kg)   SpO2 98%  BMI 40.90 kg/m  Wt Readings from Last 3 Encounters:  04/06/23 245 lb 12.8 oz (111.5 kg)  03/05/23 260 lb (117.9 kg)  03/05/23 263 lb (119.3 kg)      Dulce Sellar, NP

## 2023-04-06 NOTE — Assessment & Plan Note (Signed)
 Significant weight loss achieved through dietary changes, especially reducing sweets & carbs and increased water intake. -Encourage continuation of current dietary habits and increased water intake. -Encouraged adding regular exercise to routine.  -F/U prn

## 2023-04-08 ENCOUNTER — Encounter: Payer: Self-pay | Admitting: Family

## 2023-07-13 ENCOUNTER — Other Ambulatory Visit: Payer: Self-pay | Admitting: Medical Genetics

## 2023-07-13 ENCOUNTER — Ambulatory Visit (INDEPENDENT_AMBULATORY_CARE_PROVIDER_SITE_OTHER): Payer: Self-pay | Admitting: Family

## 2023-07-13 ENCOUNTER — Ambulatory Visit: Payer: Self-pay | Admitting: Family

## 2023-07-13 VITALS — BP 128/80 | HR 66 | Temp 97.9°F | Ht 65.0 in | Wt 248.4 lb

## 2023-07-13 DIAGNOSIS — I10 Essential (primary) hypertension: Secondary | ICD-10-CM

## 2023-07-13 MED ORDER — AMLODIPINE BESYLATE 5 MG PO TABS: 5.0000 mg | ORAL_TABLET | Freq: Every evening | ORAL | 1 refills | Status: AC

## 2023-07-13 NOTE — Patient Instructions (Signed)
 It was very nice to see you today!   I will review your lab results via MyChart in a few days.   Have a great rest of the week!   PLEASE NOTE:  If you had any lab tests please let us know if you have not heard back within a few days. You may see your results on MyChart before we have a chance to review them but we will give you a call once they are reviewed by [place]. If we ordered any referrals today, please let us know if you have not heard from their office within the next week.

## 2023-07-13 NOTE — Progress Notes (Signed)
   Patient ID: [name], female    DOB: [birth year], 63 y.o.   MRN: [id number]  Chief Complaint  Patient presents with   Follow-up    Patient states slight dizziness. Everything is going well.   Discussed the use of AI scribe software for clinical note transcription with the patient, who gave verbal consent to proceed.  History of Present Illness Ann Aguilar is a 63 year old female with hypertension who presents for a follow-up on blood pressure management.  She takes 5 mg of medication at night for hypertension. She experiences occasional dizziness, particularly when rising at night, which has improved with slower movements. She maintains hydration by drinking two liters of water daily and avoids skipping meals. She has started a 4:30 AM workout routine with a coworker, which she finds beneficial for her well-being and weight management. She experiences morning nasal stuffiness, which improves throughout the day, with no other associated symptoms.  Assessment & Plan Hypertension - Hypertension well-controlled on 5 mg Amlodipine  qhs. Occasional dizziness likely due to medication timing and orthostatic changes. - Continue antihypertensive at 5 mg, sending refill. - Monitor blood pressure at home if feeling symptomatic. - Encourage continued hydration and slow positional changes. - Continue regular exercise. - Follow-up in six months.  Nasal Congestion - Intermittent morning nasal congestion possibly due to environmental factors. - Use nasal saline spray. - Consider over the counter generic Flonase or Nasacort at night for 2-3 days at a time, or safe to take daily if needed. - Elevate head of bed.   Subjective:     Outpatient Medications Prior to Visit  Medication Sig Dispense Refill   amLODipine  (NORVASC ) 5 MG tablet Take 1 tablet (5 mg total) by mouth every evening. 90 tablet 1   Multiple Vitamin (MULTIVITAMIN) tablet Take 1 tablet by mouth daily.     No  facility-administered medications prior to visit.   Past Medical History:  Diagnosis Date   Medical history non-contributory    Past Surgical History:  Procedure Laterality Date   TUBAL LIGATION  07/1994   No Known Allergies    Objective:    Physical Exam Vitals and nursing note reviewed.  Constitutional:      Appearance: Normal appearance. She is obese.  Cardiovascular:     Rate and Rhythm: Normal rate and regular rhythm.  Pulmonary:     Effort: Pulmonary effort is normal.     Breath sounds: Normal breath sounds.  Musculoskeletal:        General: Normal range of motion.  Skin:    General: Skin is warm and dry.  Neurological:     Mental Status: She is alert.  Psychiatric:        Mood and Affect: Mood normal.        Behavior: Behavior normal.    BP 128/80   Pulse 66   Temp 97.9 F (36.6 C) (Temporal)   Ht 5\' 5"  (1.651 m)   Wt 248 lb 6.4 oz (112.7 kg)   SpO2 98%   BMI 41.34 kg/m  Wt Readings from Last 3 Encounters:  07/13/23 248 lb 6.4 oz (112.7 kg)  04/06/23 245 lb 12.8 oz (111.5 kg)  03/05/23 260 lb (117.9 kg)      Versa Gore, NP

## 2023-07-13 NOTE — Assessment & Plan Note (Signed)
 Hypertension well-controlled on 5 mg Amlodipine  qhs. Occasional dizziness likely due to medication timing and orthostatic changes. - Continue antihypertensive at 5 mg, sending refill. - Monitor blood pressure at home if feeling symptomatic. - Encourage continued hydration and slow positional changes. - Continue regular exercise. - Follow-up in six months.

## 2023-11-27 ENCOUNTER — Other Ambulatory Visit: Payer: Self-pay | Admitting: Medical Genetics

## 2023-11-27 DIAGNOSIS — Z006 Encounter for examination for normal comparison and control in clinical research program: Secondary | ICD-10-CM

## 2023-12-25 ENCOUNTER — Emergency Department (HOSPITAL_COMMUNITY)

## 2023-12-25 ENCOUNTER — Encounter (HOSPITAL_COMMUNITY): Payer: Self-pay | Admitting: *Deleted

## 2023-12-25 ENCOUNTER — Other Ambulatory Visit: Payer: Self-pay

## 2023-12-25 ENCOUNTER — Emergency Department (HOSPITAL_COMMUNITY)
Admission: EM | Admit: 2023-12-25 | Discharge: 2023-12-25 | Disposition: A | Discharge status: Home / Self Care | Attending: Emergency Medicine | Admitting: Emergency Medicine

## 2023-12-25 DIAGNOSIS — Y92410 Unspecified street and highway as the place of occurrence of the external cause: Secondary | ICD-10-CM | POA: Insufficient documentation

## 2023-12-25 DIAGNOSIS — S5012XA Contusion of left forearm, initial encounter: Secondary | ICD-10-CM | POA: Insufficient documentation

## 2023-12-25 DIAGNOSIS — M79632 Pain in left forearm: Secondary | ICD-10-CM | POA: Diagnosis present

## 2023-12-25 MED ORDER — IBUPROFEN 600 MG PO TABS: 600.0000 mg | ORAL_TABLET | Freq: Four times a day (QID) | ORAL | 0 refills | Status: AC | PRN

## 2023-12-25 MED ORDER — IBUPROFEN 800 MG PO TABS
800.0000 mg | ORAL_TABLET | Freq: Once | ORAL | Status: AC
Start: 2023-12-25 — End: 2023-12-25
  Administered 2023-12-25: 800 mg via ORAL
  Filled 2023-12-25: qty 1

## 2023-12-25 MED ORDER — CYCLOBENZAPRINE HCL 10 MG PO TABS: 10.0000 mg | ORAL_TABLET | Freq: Two times a day (BID) | ORAL | 0 refills | Status: AC | PRN
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
## 2024-01-21 ENCOUNTER — Encounter: Payer: Self-pay | Admitting: Family

## 2024-01-21 ENCOUNTER — Telehealth: Payer: Self-pay

## 2024-01-21 DIAGNOSIS — Z1231 Encounter for screening mammogram for malignant neoplasm of breast: Secondary | ICD-10-CM

## 2024-01-21 NOTE — Telephone Encounter (Signed)
 Patient telephoned and requested a mammogram scholarship application. Confirmed patient's address. [facility]

## 2024-02-10 ENCOUNTER — Telehealth: Payer: Self-pay

## 2024-02-10 NOTE — Telephone Encounter (Signed)
 I have left this pt a detailed message inviting her to have her cervical screening (PAP) done on April 07, 2024. I requested pt call back to [facility] at [phone] to schedule this appt.
